# Patient Record
Sex: Female | Born: 1995 | Race: Black or African American | Hispanic: No | Marital: Single | Smoking: Current every day smoker
Health system: Southern US, Community
[De-identification: ages and names within clinical notes are randomized; demographics above are authoritative.]

## PROBLEM LIST (undated history)

## (undated) DIAGNOSIS — F329 Major depressive disorder, single episode, unspecified: Secondary | ICD-10-CM

## (undated) DIAGNOSIS — M779 Enthesopathy, unspecified: Secondary | ICD-10-CM

## (undated) DIAGNOSIS — F32A Depression, unspecified: Secondary | ICD-10-CM

---
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
## 2018-01-05 ENCOUNTER — Emergency Department (HOSPITAL_BASED_OUTPATIENT_CLINIC_OR_DEPARTMENT_OTHER)
Admission: EM | Admit: 2018-01-05 | Discharge: 2018-01-05 | Disposition: A | Discharge status: Home / Self Care | Payer: Managed Care, Other (non HMO) | Attending: Emergency Medicine | Admitting: Emergency Medicine

## 2018-01-05 ENCOUNTER — Other Ambulatory Visit: Payer: Self-pay

## 2018-01-05 ENCOUNTER — Emergency Department (HOSPITAL_BASED_OUTPATIENT_CLINIC_OR_DEPARTMENT_OTHER): Payer: Managed Care, Other (non HMO)

## 2018-01-05 ENCOUNTER — Encounter (HOSPITAL_BASED_OUTPATIENT_CLINIC_OR_DEPARTMENT_OTHER): Payer: Self-pay | Admitting: Emergency Medicine

## 2018-01-05 DIAGNOSIS — Z79899 Other long term (current) drug therapy: Secondary | ICD-10-CM | POA: Diagnosis not present

## 2018-01-05 DIAGNOSIS — R69 Illness, unspecified: Secondary | ICD-10-CM

## 2018-01-05 DIAGNOSIS — M7918 Myalgia, other site: Secondary | ICD-10-CM | POA: Diagnosis present

## 2018-01-05 DIAGNOSIS — J111 Influenza due to unidentified influenza virus with other respiratory manifestations: Secondary | ICD-10-CM

## 2018-01-05 DIAGNOSIS — F17210 Nicotine dependence, cigarettes, uncomplicated: Secondary | ICD-10-CM | POA: Insufficient documentation

## 2018-01-05 DIAGNOSIS — R112 Nausea with vomiting, unspecified: Secondary | ICD-10-CM | POA: Diagnosis not present

## 2018-01-05 HISTORY — DX: Depression, unspecified: F32.A

## 2018-01-05 HISTORY — DX: Major depressive disorder, single episode, unspecified: F32.9

## 2018-01-05 HISTORY — DX: Enthesopathy, unspecified: M77.9

## 2018-01-05 LAB — CBC WITH DIFFERENTIAL/PLATELET
Basophils Absolute: 0 10*3/uL (ref 0.0–0.1)
Basophils Relative: 0 %
Eosinophils Absolute: 0 10*3/uL (ref 0.0–0.7)
Eosinophils Relative: 0 %
HCT: 42.5 % (ref 36.0–46.0)
Hemoglobin: 14.8 g/dL (ref 12.0–15.0)
Lymphocytes Relative: 11 %
Lymphs Abs: 0.4 10*3/uL — ABNORMAL LOW (ref 0.7–4.0)
MCH: 27.7 pg (ref 26.0–34.0)
MCHC: 34.8 g/dL (ref 30.0–36.0)
MCV: 79.4 fL (ref 78.0–100.0)
Monocytes Absolute: 0.3 10*3/uL (ref 0.1–1.0)
Monocytes Relative: 9 %
Neutro Abs: 2.6 10*3/uL (ref 1.7–7.7)
Neutrophils Relative %: 80 %
Platelets: 143 10*3/uL — ABNORMAL LOW (ref 150–400)
RBC: 5.35 MIL/uL — ABNORMAL HIGH (ref 3.87–5.11)
RDW: 13.3 % (ref 11.5–15.5)
WBC: 3.3 10*3/uL — ABNORMAL LOW (ref 4.0–10.5)

## 2018-01-05 LAB — RAPID STREP SCREEN (MED CTR MEBANE ONLY): Streptococcus, Group A Screen (Direct): NEGATIVE

## 2018-01-05 LAB — COMPREHENSIVE METABOLIC PANEL
ALT: 18 U/L (ref 14–54)
AST: 34 U/L (ref 15–41)
Albumin: 3.6 g/dL (ref 3.5–5.0)
Alkaline Phosphatase: 50 U/L (ref 38–126)
Anion gap: 10 (ref 5–15)
BUN: 8 mg/dL (ref 6–20)
CO2: 21 mmol/L — ABNORMAL LOW (ref 22–32)
Calcium: 8.4 mg/dL — ABNORMAL LOW (ref 8.9–10.3)
Chloride: 102 mmol/L (ref 101–111)
Creatinine, Ser: 1.01 mg/dL — ABNORMAL HIGH (ref 0.44–1.00)
GFR calc Af Amer: >60 mL/min (ref >60)
GFR calc non Af Amer: >60 mL/min (ref >60)
Glucose, Bld: 123 mg/dL — ABNORMAL HIGH (ref 65–99)
Potassium: 4 mmol/L (ref 3.5–5.1)
Sodium: 133 mmol/L — ABNORMAL LOW (ref 135–145)
Total Bilirubin: 0.4 mg/dL (ref 0.3–1.2)
Total Protein: 7.3 g/dL (ref 6.5–8.1)

## 2018-01-05 LAB — LIPASE, BLOOD: Lipase: 25 U/L (ref 11–51)

## 2018-01-05 MED ORDER — IBUPROFEN 600 MG PO TABS: 600.0000 mg | ORAL_TABLET | Freq: Four times a day (QID) | ORAL | 0 refills | Status: DC | PRN

## 2018-01-05 MED ORDER — ONDANSETRON 4 MG PO TBDP
4.0000 mg | ORAL_TABLET | Freq: Once | ORAL | Status: AC | PRN
  Administered 2018-01-05: 4 mg via ORAL
  Filled 2018-01-05: qty 1

## 2018-01-05 MED ORDER — ACETAMINOPHEN 325 MG PO TABS
650.0000 mg | ORAL_TABLET | Freq: Once | ORAL | Status: AC | PRN
  Administered 2018-01-05: 650 mg via ORAL
  Filled 2018-01-05: qty 2

## 2018-01-05 MED ORDER — SODIUM CHLORIDE 0.9 % IV BOLUS
1000.0000 mL | Freq: Once | INTRAVENOUS | Status: AC
  Administered 2018-01-05: 1000 mL via INTRAVENOUS

## 2018-01-05 MED ORDER — KETOROLAC TROMETHAMINE 30 MG/ML IJ SOLN
30.0000 mg | Freq: Once | INTRAMUSCULAR | Status: AC
  Administered 2018-01-05: 30 mg via INTRAVENOUS
  Filled 2018-01-05: qty 1

## 2018-01-05 MED ORDER — ONDANSETRON HCL 4 MG PO TABS: 4.0000 mg | ORAL_TABLET | Freq: Four times a day (QID) | ORAL | 0 refills | Status: AC

## 2018-01-05 MED ORDER — ACETAMINOPHEN 500 MG PO TABS: 500.0000 mg | ORAL_TABLET | Freq: Four times a day (QID) | ORAL | 0 refills | Status: AC | PRN

## 2018-01-05 NOTE — ED Triage Notes (Signed)
Patient states that she started to have a "migraine" headache 2 days ago. She also has chronic pain to her right arm. So she reports that she took her arm medication instead of her headache medication and now have generalized aches all over and threw up one time at work.

## 2018-01-05 NOTE — ED Provider Notes (Signed)
[facility] EMERGENCY DEPARTMENT Provider Note   CSN: [id number] Arrival date & time: 01/05/18  1814     History   Chief Complaint Chief Complaint  Patient presents with  . Generalized Body Aches    HPI Shannon Shah is a 22 y.o. female with history of depression, tendinitis who presents with a 3-day history of fever, chills, headache, and 1 to 2-day history of nausea and emesis.  Patient has had intermittent abdominal cramping.  She denies diarrhea or bloody stools.  She has had generalized body aches.  She reports a tightness in her chest and has had intermittent cough.  She has had nasal congestion.  She denies any shortness of breath.  She denies any new leg pain or swelling, recent long trips, surgeries, known cancer.  She has not taken any medications at home for symptoms.  She denies any urinary symptoms.  HPI  Past Medical History:  Diagnosis Date  . Depression   . Tendonitis     There are no active problems to display for this patient.   History reviewed. No pertinent surgical history.   OB History   None      Home Medications    Prior to Admission medications   Medication Sig Start Date End Date Taking? Authorizing Provider  citalopram (CELEXA) 20 MG tablet Take 20 mg by mouth daily.   Yes [provider]  acetaminophen (TYLENOL) 500 MG tablet Take 1 tablet (500 mg total) by mouth every 6 (six) hours as needed. 01/05/18   Payam Gribble, Waylan Boga, PA-C  ibuprofen (ADVIL,MOTRIN) 600 MG tablet Take 1 tablet (600 mg total) by mouth every 6 (six) hours as needed. 01/05/18   Nakiya Rallis, Waylan Boga, PA-C  ondansetron (ZOFRAN) 4 MG tablet Take 1 tablet (4 mg total) by mouth every 6 (six) hours. 01/05/18   Emi Holes, PA-C    Family History History reviewed. No pertinent family history.  Social History Social History   Tobacco Use  . Smoking status: Current Every Day Smoker  . Smokeless tobacco: Never Used  Substance Use Topics  . Alcohol use: Never     Frequency: Never  . Drug use: Never     Allergies   Amoxicillin   Review of Systems Review of Systems  Constitutional: Positive for appetite change, chills and fever.  HENT: Positive for congestion. Negative for ear pain, facial swelling and sore throat.   Respiratory: Positive for cough. Negative for shortness of breath.   Cardiovascular: Negative for chest pain.  Gastrointestinal: Positive for abdominal pain (cramping), nausea and vomiting. Negative for diarrhea.  Genitourinary: Negative for dysuria and flank pain.  Musculoskeletal: Negative for back pain.  Skin: Negative for rash and wound.  Neurological: Positive for headaches.  Psychiatric/Behavioral: The patient is not nervous/anxious.      Physical Exam Updated Vital Signs BP (!) 111/58 (BP Location: Right Arm)   Pulse 74   Temp 98.2 F (36.8 C) (Oral)   Resp 16   Ht  (1.702 m)   Wt 117.9 kg (260 lb)   LMP 01/03/2018   SpO2 100%   BMI 40.72 kg/m   Physical Exam  Constitutional: She appears well-developed and well-nourished. No distress.  HENT:  Head: Normocephalic and atraumatic.  Right Ear: Tympanic membrane normal.  Left Ear: Tympanic membrane normal.  Mouth/Throat: Oropharynx is clear and moist. No oropharyngeal exudate.  Eyes: Pupils are equal, round, and reactive to light. Conjunctivae are normal. Right eye exhibits no discharge. Left eye exhibits no  discharge. No scleral icterus.  Neck: Normal range of motion. Neck supple. No thyromegaly present.  Cardiovascular: Normal rate, regular rhythm, normal heart sounds and intact distal pulses. Exam reveals no gallop and no friction rub.  No murmur heard. Pulmonary/Chest: Effort normal and breath sounds normal. No stridor. No respiratory distress. She has no wheezes. She has no rales.  Abdominal: Soft. Bowel sounds are normal. She exhibits no distension. There is no tenderness. There is no rebound and no guarding.  Musculoskeletal: She exhibits no  edema.  Lymphadenopathy:    She has no cervical adenopathy.  Neurological: She is alert. Coordination normal.  Skin: Skin is warm and dry. No rash noted. She is not diaphoretic. No pallor.  Psychiatric: She has a normal mood and affect.  Nursing note and vitals reviewed.    ED Treatments / Results  Labs (all labs ordered are listed, but only abnormal results are displayed) Labs Reviewed  COMPREHENSIVE METABOLIC PANEL - Abnormal; Notable for the following components:      Result Value   Sodium 133 (*)    CO2 21 (*)    Glucose, Bld 123 (*)    Creatinine, Ser 1.01 (*)    Calcium 8.4 (*)    All other components within normal limits  CBC WITH DIFFERENTIAL/PLATELET - Abnormal; Notable for the following components:   WBC 3.3 (*)    RBC 5.35 (*)    Platelets 143 (*)    Lymphs Abs 0.4 (*)    All other components within normal limits  RAPID STREP SCREEN ([facility] ONLY)  CULTURE, GROUP A STREP ([facility])  LIPASE, BLOOD    EKG None  Radiology Dg Chest 2 View  Result Date: 01/05/2018 CLINICAL DATA:  Shortness of breath and chest pain EXAM: CHEST - 2 VIEW COMPARISON:  None. FINDINGS: There is no edema or consolidation. Heart size and pulmonary vascularity are normal. No adenopathy. There is midthoracic dextroscoliosis. IMPRESSION: No edema or consolidation. Electronically Signed   By: Bretta Bang III M.D.   On: 01/05/2018 20:00    Procedures Procedures (including critical care time)  Medications Ordered in ED Medications  acetaminophen (TYLENOL) tablet 650 mg (650 mg Oral Given 01/05/18 1850)  ondansetron (ZOFRAN-ODT) disintegrating tablet 4 mg (4 mg Oral Given 01/05/18 1850)  ketorolac (TORADOL) 30 MG/ML injection 30 mg (30 mg Intravenous Given 01/05/18 1941)  sodium chloride 0.9 % bolus 1,000 mL (0 mLs Intravenous Stopped 01/05/18 2051)     Initial Impression / Assessment and Plan / ED Course  I have reviewed the triage vital signs and the nursing notes.  Pertinent labs &  imaging results that were available during my care of the patient were reviewed by me and considered in my medical decision making (see chart for details).     Patient with influenza-like symptoms versus other viral syndrome.  Patient's fever reduced in the ED with Tylenol and Toradol.  Headache improved after Toradol and fluids.  CBC shows WBC 3.3, platelets 143.  CMP shows sodium 133, creatinine 1.01. Lipase within normal limits.  Strep negative.  Chest x-ray negative.  Patient is tolerating oral fluids.  She is feeling much better including her chest tightness.  I feel her chest pain is related to her generalized body aches.  Will treat supportively with Zofran, ibuprofen, Tylenol.  Advised supportive treatment including rest and fluids.  Return precautions discussed.  Follow-up to PCP as needed.  Patient vitals stable and discharged in satisfactory condition.  Final Clinical Impressions(s) / ED Diagnoses  Final diagnoses:  Influenza-like illness    ED Discharge Orders        Ordered    ondansetron (ZOFRAN) 4 MG tablet  Every 6 hours     01/05/18 2048    ibuprofen (ADVIL,MOTRIN) 600 MG tablet  Every 6 hours PRN     01/05/18 2048    acetaminophen (TYLENOL) 500 MG tablet  Every 6 hours PRN     01/05/18 2048       Emi Holes, PA-C 01/06/18 9147    Arby Barrette, MD 01/14/18 [phone]

## 2018-01-05 NOTE — ED Notes (Signed)
Ok for patient to have water per Dr. Donnald Garre

## 2018-01-05 NOTE — Discharge Instructions (Signed)
Medications: Zofran, ibuprofen, Tylenol  Treatment: Take Zofran every 6 hours as needed for nausea or vomiting.  Take ibuprofen and Tylenol alternating as prescribed.  Drink plenty of fluids.  Get plenty of rest.  Follow-up: Please follow-up with your doctor in 3 to 4 days for recheck.  Please return to the emergency department if you develop any new or worsening symptoms.  I recommend repeat lab work with your PCP in 2 to 3 weeks when you are feeling better.

## 2018-01-05 NOTE — ED Notes (Signed)
ED Provider at bedside. 

## 2018-01-08 LAB — CULTURE, GROUP A STREP (THRC)

## 2019-05-31 IMAGING — DX DG CHEST 2V
3 series · 3 of 3 positions shown · non-contrast
Comparison: None.

CLINICAL DATA: Shortness of breath and chest pain

EXAM:
CHEST - 2 VIEW

[chest pa]
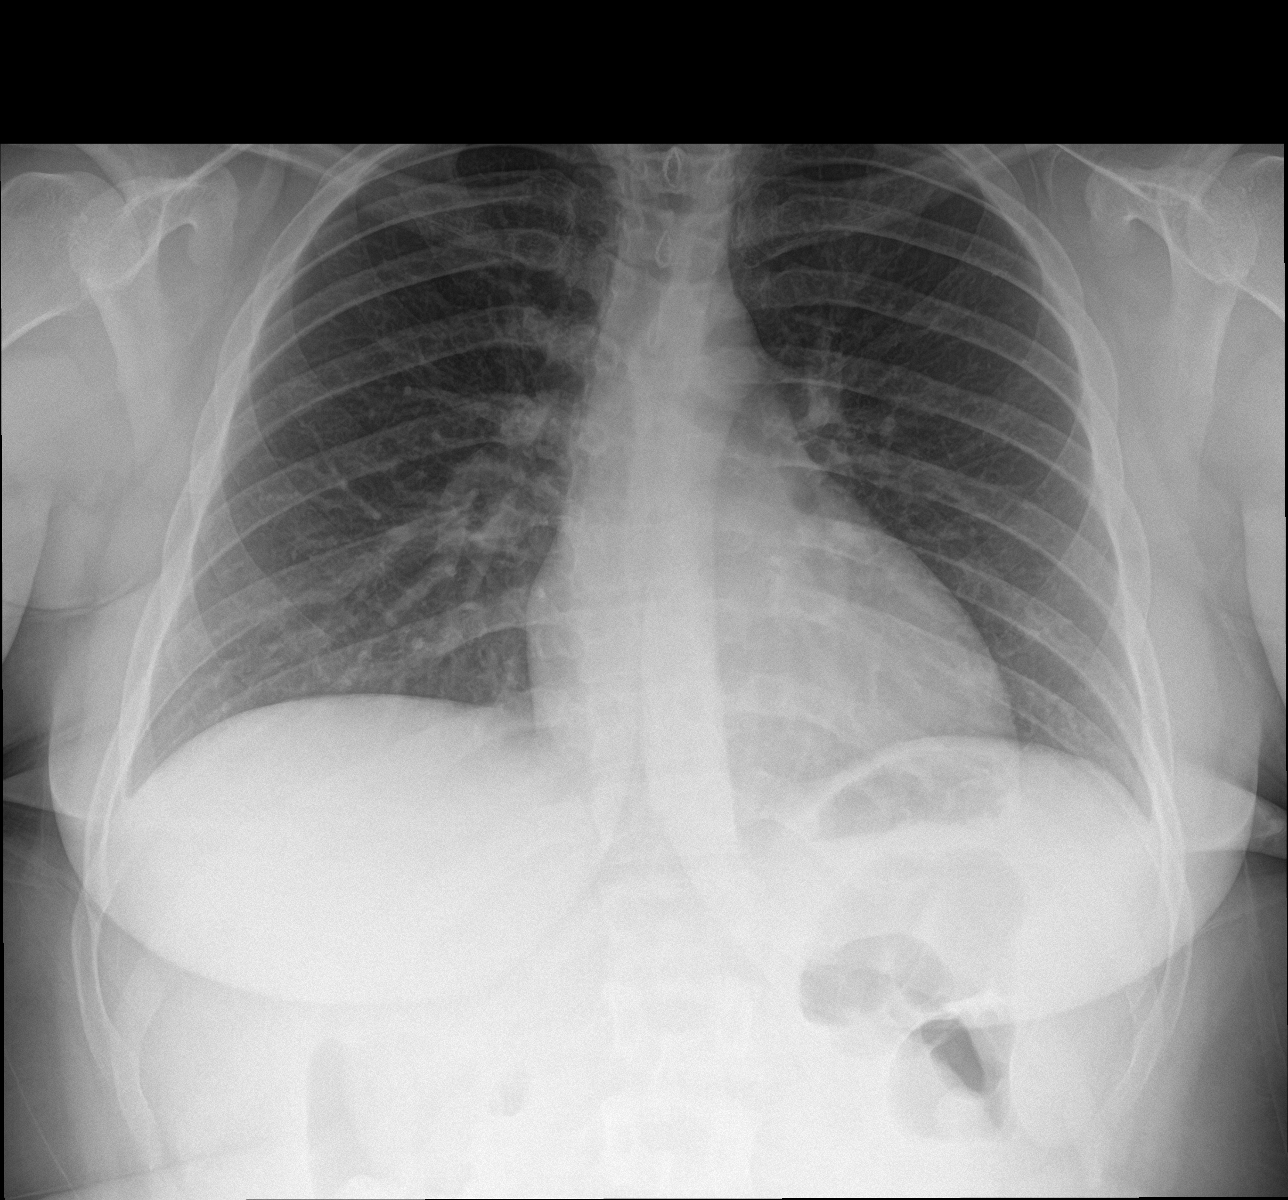

[chest lat (1 of 2)]
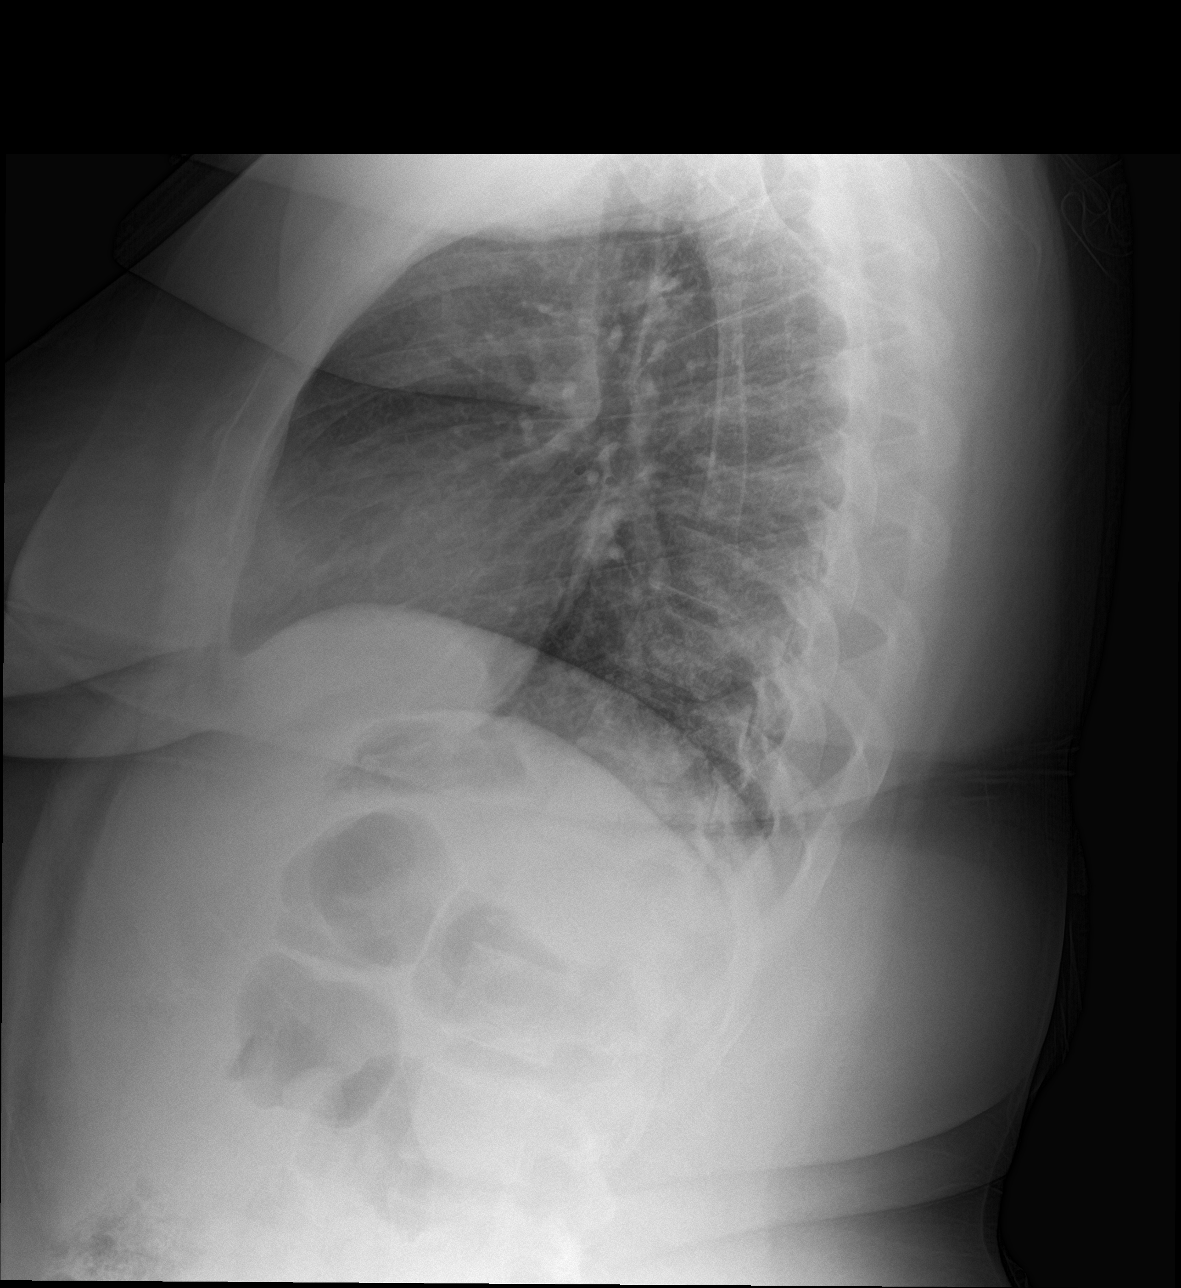

[chest lat (2 of 2)]
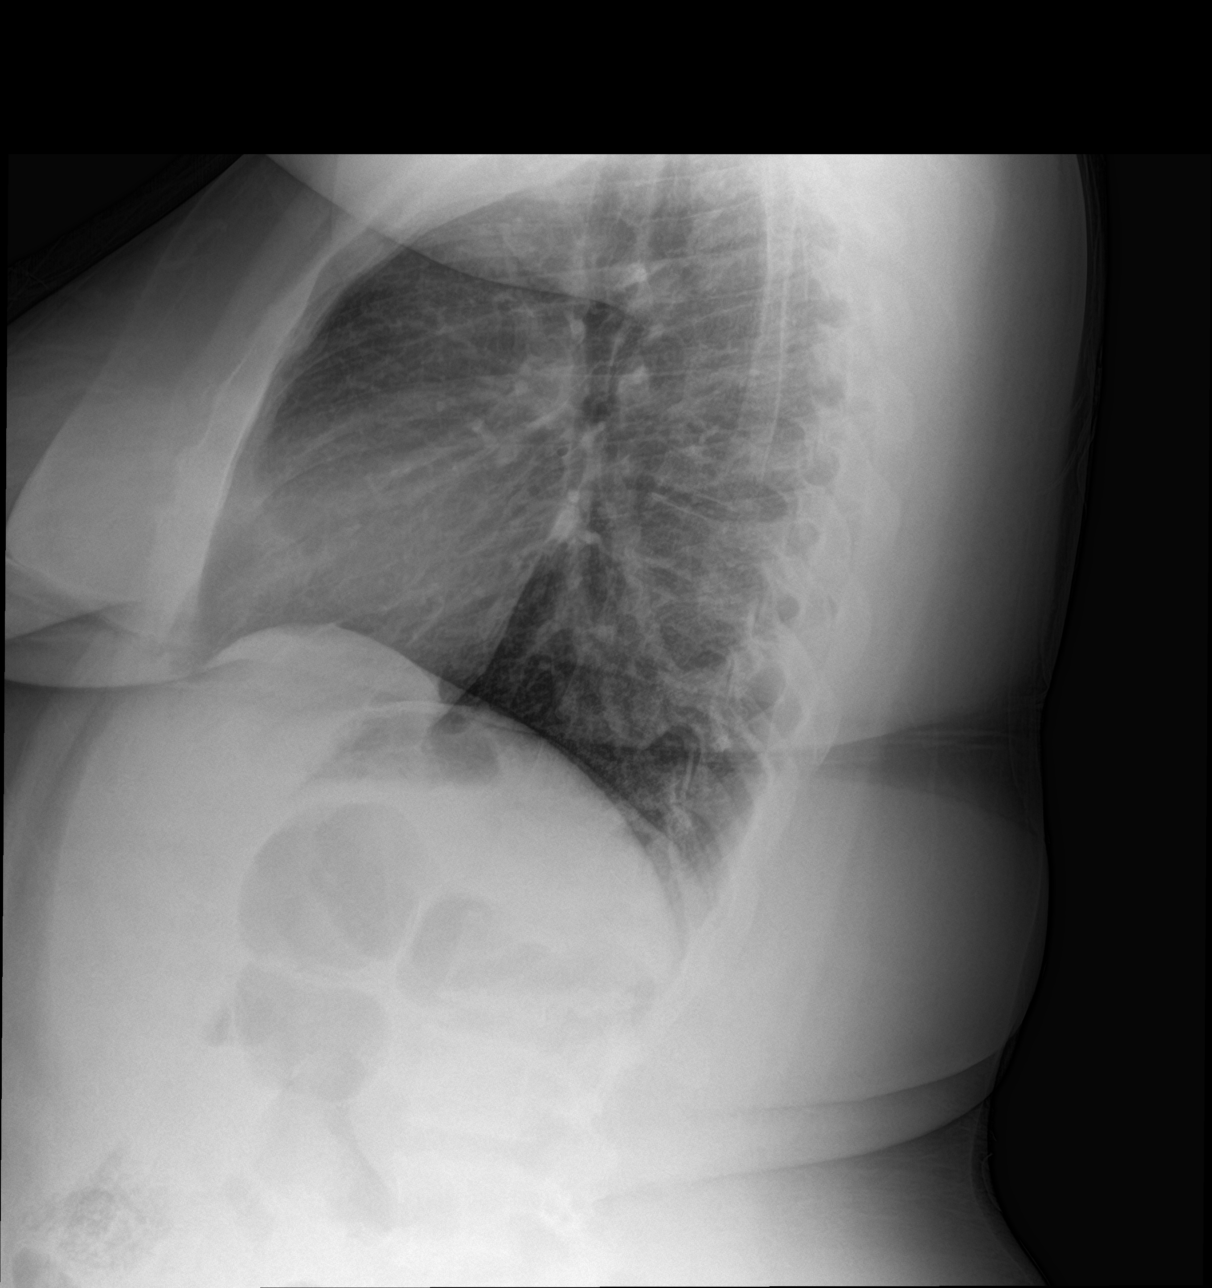

[3 of 3 positions shown; findings below may reference images not displayed]

FINDINGS: There is no edema or consolidation. Heart size and pulmonary
vascularity are normal. No adenopathy. There is midthoracic
dextroscoliosis.
IMPRESSION: No edema or consolidation.

## 2020-08-22 ENCOUNTER — Ambulatory Visit
Admission: EM | Admit: 2020-08-22 | Discharge: 2020-08-22 | Disposition: A | Discharge status: Home / Self Care | Payer: BC Managed Care – PPO | Attending: Internal Medicine | Admitting: Internal Medicine

## 2020-08-22 ENCOUNTER — Other Ambulatory Visit: Payer: Self-pay

## 2020-08-22 DIAGNOSIS — M549 Dorsalgia, unspecified: Secondary | ICD-10-CM

## 2020-08-22 MED ORDER — IBUPROFEN 600 MG PO TABS: 600.0000 mg | ORAL_TABLET | Freq: Four times a day (QID) | ORAL | 0 refills | Status: DC | PRN

## 2020-08-22 MED ORDER — IBUPROFEN 600 MG PO TABS: 600.0000 mg | ORAL_TABLET | Freq: Four times a day (QID) | ORAL | 0 refills | Status: AC | PRN

## 2020-08-22 MED ORDER — CYCLOBENZAPRINE HCL 5 MG PO TABS: 5.0000 mg | ORAL_TABLET | Freq: Two times a day (BID) | ORAL | 0 refills | Status: AC | PRN

## 2020-08-22 NOTE — Discharge Instructions (Signed)
Medications as prescribed Gentle range of motion exercises No indication for x-rays at this time. If symptoms worsen please return to urgent care to be reevaluated.
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
## 2024-02-09 ENCOUNTER — Emergency Department (HOSPITAL_BASED_OUTPATIENT_CLINIC_OR_DEPARTMENT_OTHER)
Admission: EM | Admit: 2024-02-09 | Discharge: 2024-02-09 | Disposition: A | Discharge status: Home / Self Care | Attending: Emergency Medicine | Admitting: Emergency Medicine

## 2024-02-09 ENCOUNTER — Encounter (HOSPITAL_BASED_OUTPATIENT_CLINIC_OR_DEPARTMENT_OTHER): Payer: Self-pay

## 2024-02-09 ENCOUNTER — Emergency Department (HOSPITAL_BASED_OUTPATIENT_CLINIC_OR_DEPARTMENT_OTHER)

## 2024-02-09 ENCOUNTER — Other Ambulatory Visit: Payer: Self-pay

## 2024-02-09 DIAGNOSIS — J36 Peritonsillar abscess: Secondary | ICD-10-CM | POA: Insufficient documentation

## 2024-02-09 DIAGNOSIS — R059 Cough, unspecified: Secondary | ICD-10-CM | POA: Diagnosis present

## 2024-02-09 LAB — CBC WITH DIFFERENTIAL/PLATELET
Abs Immature Granulocytes: 0.04 10*3/uL (ref 0.00–0.07)
Basophils Absolute: 0 10*3/uL (ref 0.0–0.1)
Basophils Relative: 0 %
Eosinophils Absolute: 0.1 10*3/uL (ref 0.0–0.5)
Eosinophils Relative: 1 %
HCT: 40.1 % (ref 36.0–46.0)
Hemoglobin: 13.2 g/dL (ref 12.0–15.0)
Immature Granulocytes: 0 %
Lymphocytes Relative: 24 %
Lymphs Abs: 2.4 10*3/uL (ref 0.7–4.0)
MCH: 27.1 pg (ref 26.0–34.0)
MCHC: 32.9 g/dL (ref 30.0–36.0)
MCV: 82.3 fL (ref 80.0–100.0)
Monocytes Absolute: 0.7 10*3/uL (ref 0.1–1.0)
Monocytes Relative: 7 %
Neutro Abs: 6.9 10*3/uL (ref 1.7–7.7)
Neutrophils Relative %: 68 %
Platelets: 303 10*3/uL (ref 150–400)
RBC: 4.87 MIL/uL (ref 3.87–5.11)
RDW: 13 % (ref 11.5–15.5)
WBC: 10.2 10*3/uL (ref 4.0–10.5)
nRBC: 0 % (ref 0.0–0.2)

## 2024-02-09 LAB — RESP PANEL BY RT-PCR (RSV, FLU A&B, COVID)  RVPGX2
Influenza A by PCR: NEGATIVE
Influenza B by PCR: NEGATIVE
Resp Syncytial Virus by PCR: NEGATIVE
SARS Coronavirus 2 by RT PCR: NEGATIVE

## 2024-02-09 LAB — BASIC METABOLIC PANEL WITH GFR
Anion gap: 12 (ref 5–15)
BUN: 6 mg/dL (ref 6–20)
CO2: 25 mmol/L (ref 22–32)
Calcium: 9.1 mg/dL (ref 8.9–10.3)
Chloride: 103 mmol/L (ref 98–111)
Creatinine, Ser: 0.85 mg/dL (ref 0.44–1.00)
GFR, Estimated: >60 mL/min (ref >60)
Glucose, Bld: 85 mg/dL (ref 70–99)
Potassium: 4.1 mmol/L (ref 3.5–5.1)
Sodium: 140 mmol/L (ref 135–145)

## 2024-02-09 LAB — HCG, SERUM, QUALITATIVE: Preg, Serum: NEGATIVE

## 2024-02-09 LAB — GROUP A STREP BY PCR: Group A Strep by PCR: NOT DETECTED

## 2024-02-09 LAB — MONONUCLEOSIS SCREEN: Mono Screen: NEGATIVE

## 2024-02-09 MED ORDER — KETOROLAC TROMETHAMINE 15 MG/ML IJ SOLN
15.0000 mg | Freq: Once | INTRAMUSCULAR | Status: AC
  Administered 2024-02-09: 15 mg via INTRAVENOUS
  Filled 2024-02-09: qty 1

## 2024-02-09 MED ORDER — LACTATED RINGERS IV BOLUS
1000.0000 mL | Freq: Once | INTRAVENOUS | Status: AC
  Administered 2024-02-09: 1000 mL via INTRAVENOUS

## 2024-02-09 MED ORDER — CLINDAMYCIN PHOSPHATE 600 MG/50ML IV SOLN
600.0000 mg | Freq: Once | INTRAVENOUS | Status: AC
  Administered 2024-02-09: 600 mg via INTRAVENOUS
  Filled 2024-02-09: qty 50

## 2024-02-09 MED ORDER — FLUCONAZOLE 200 MG PO TABS: 200.0000 mg | ORAL_TABLET | Freq: Once | ORAL | 0 refills | Status: AC

## 2024-02-09 MED ORDER — DEXAMETHASONE SODIUM PHOSPHATE 10 MG/ML IJ SOLN
10.0000 mg | Freq: Once | INTRAMUSCULAR | Status: AC
  Administered 2024-02-09: 10 mg via INTRAVENOUS
  Filled 2024-02-09: qty 1

## 2024-02-09 MED ORDER — IOHEXOL 300 MG/ML  SOLN
75.0000 mL | Freq: Once | INTRAMUSCULAR | Status: AC | PRN
  Administered 2024-02-09: 75 mL via INTRAVENOUS

## 2024-02-09 MED ORDER — SODIUM CHLORIDE 0.9 % IV SOLN: INTRAVENOUS | Status: DC | PRN

## 2024-02-09 MED ORDER — CLINDAMYCIN HCL 150 MG PO CAPS: 450.0000 mg | ORAL_CAPSULE | Freq: Three times a day (TID) | ORAL | 0 refills | Status: AC

## 2024-02-09 NOTE — ED Notes (Signed)
 Patient transported to CT via stretcher.
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
## 2024-02-09 NOTE — ED Provider Notes (Signed)
 [place] [facility] Provider Note   CSN: [id number] Arrival date & time: 02/09/24  9176     Patient presents with: Sore Throat   Shannon Shah is a 28 y.o. female with no significant past medical history presents with concern for sore throat ongoing for the past 2 to 3 weeks.  States her right tonsil was initially the tonsil that was painful, and was treated with a course of azithromycin which did resolve her symptoms.  Shortly after completing this course of azithromycin, her left tonsil seemed to become swollen and painful.  She is still able to swallow, but reports pain with swallowing.  Also reports some congestion, ear pain, and mild dry cough.  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    Sore Throat       Prior to Admission medications   Medication Sig Start Date End Date Taking? Authorizing Provider  clindamycin  (CLEOCIN ) 150 MG capsule Take 3 capsules (450 mg total) by mouth 3 (three) times daily for 10 days. 02/09/24 02/19/24 Yes Veta Palma, PA-C  acetaminophen  (TYLENOL ) 500 MG tablet Take 1 tablet (500 mg total) by mouth every 6 (six) hours as needed. 01/05/18   Law, Alexandra M, PA-C  citalopram (CELEXA) 20 MG tablet Take 20 mg by mouth daily.    [provider]  cyclobenzaprine  (FLEXERIL ) 5 MG tablet Take 1 tablet (5 mg total) by mouth 2 (two) times daily as needed for muscle spasms. 08/22/20   LampteyAleene KIDD, MD  ibuprofen  (ADVIL ) 600 MG tablet Take 1 tablet (600 mg total) by mouth every 6 (six) hours as needed. 08/22/20   Lamptey, Aleene KIDD, MD  ondansetron  (ZOFRAN ) 4 MG tablet Take 1 tablet (4 mg total) by mouth every 6 (six) hours. 01/05/18   Rendell Lorane HERO, PA-C    Allergies: Amoxicillin    Review of Systems  HENT:  Positive for sore throat.     Updated Vital Signs BP 109/71 (BP Location: Left Arm)   Pulse 62   Temp 97.9 F (36.6 C) (Oral)   Resp 18   Ht 5' 7 (1.702 m)   Wt 122.5 kg   LMP 01/14/2024    SpO2 100%   BMI 42.29 kg/m   Physical Exam Vitals and nursing note reviewed.  Constitutional:      General: She is not in acute distress.    Appearance: She is well-developed.  HENT:     Head: Normocephalic and atraumatic.     Right Ear: Tympanic membrane normal.     Left Ear: Tympanic membrane normal.     Nose: Congestion present.     Mouth/Throat:     Comments: Able to open mouth fully, but with some discomfort  Left-sided peritonsillar abscess noted.  Uvula midline.  Able to visualize posterior oropharynx on the right side Patient swallowing secretions without difficulty.  Talking without muffled voice  Eyes:     Conjunctiva/sclera: Conjunctivae normal.    Cardiovascular:     Rate and Rhythm: Normal rate and regular rhythm.     Heart sounds: No murmur heard. Pulmonary:     Effort: Pulmonary effort is normal. No respiratory distress.     Breath sounds: Normal breath sounds.  Abdominal:     Palpations: Abdomen is soft.     Tenderness: There is no abdominal tenderness.   Musculoskeletal:        General: No swelling.     Cervical back: Neck supple.   Skin:    General:  Skin is warm and dry.     Capillary Refill: Capillary refill takes less than 2 seconds.   Neurological:     Mental Status: She is alert.   Psychiatric:        Mood and Affect: Mood normal.     (all labs ordered are listed, but only abnormal results are displayed) Labs Reviewed  RESP PANEL BY RT-PCR (RSV, FLU A&B, COVID)  [id number]  GROUP A STREP BY PCR  CBC WITH DIFFERENTIAL/PLATELET  BASIC METABOLIC PANEL WITH GFR  MONONUCLEOSIS SCREEN  HCG, SERUM, QUALITATIVE    EKG: None  Radiology: CT Soft Tissue Neck W Contrast Result Date: 02/09/2024 CLINICAL DATA:  Provided history: Left peritonsillar abscess. Sore throat, swollen left tonsil, jaw pain and swelling. EXAM: CT NECK WITH CONTRAST TECHNIQUE: Multidetector CT imaging of the neck was performed using the standard protocol following the bolus  administration of intravenous contrast. RADIATION DOSE REDUCTION: This exam was performed according to the departmental dose-optimization program which includes automated exposure control, adjustment of the mA and/or kV according to patient size and/or use of iterative reconstruction technique. CONTRAST:  75mL OMNIPAQUE  IOHEXOL  300 MG/ML  SOLN COMPARISON:  None. FINDINGS: Pharynx and larynx: Prominence of the palatine tonsils (left greater than right). Superimposed 1.8 x 2.0 circumscribed hypodense focus in the region of the left palatine tonsil compatible with a tonsillar/peritonsillar abscess (series 3, image 36). No more than mild effacement of the oropharyngeal airway. Unremarkable appearance of the epiglottis/larynx. Salivary glands: No inflammation, mass, or stone. Thyroid: Unremarkable. Lymph nodes: Mildly enlarged level 2 lymph nodes bilaterally, likely reactive. Vascular: The major vascular structures of the neck are patent. Limited intracranial: No evidence of an acute intracranial abnormality within the field of view. Visualized orbits: Incompletely imaged. No orbital mass or acute orbital finding at the imaged levels. Mastoids and visualized paranasal sinuses: Portions of the frontal sinuses are excluded from the field of view superiorly. No significant paranasal sinus disease or mastoid effusion at the imaged levels. Skeleton: Nonspecific reversal of the expected cervical lordosis. Slight grade 1 anterolisthesis at C2-C3, C3-C4 and C4-C5. Upper chest: No consolidation within the imaged lung apices. IMPRESSION: 1. Findings compatible with acute tonsillitis/pharyngitis. Superimposed 1.8 x 2.0 cm left tonsillar/peritonsillar abscess. No more than mild effacement of the oropharyngeal airway. 2. Mildly prominent bilateral level 2 lymph nodes, likely reactive. Electronically Signed   By: Rockey Childs D.O.   On: 02/09/2024 10:49     Procedures   Medications Ordered in the ED  0.9 %  sodium chloride   infusion ( Intravenous New Bag/Given 02/09/24 0938)  lactated ringers  bolus 1,000 mL (0 mLs Intravenous Stopped 02/09/24 1058)  ketorolac  (TORADOL ) 15 MG/ML injection 15 mg (15 mg Intravenous Given 02/09/24 0932)  dexamethasone  (DECADRON ) injection 10 mg (10 mg Intravenous Given 02/09/24 0932)  clindamycin  (CLEOCIN ) IVPB 600 mg (0 mg Intravenous Stopped 02/09/24 1057)  iohexol  (OMNIPAQUE ) 300 MG/ML solution 75 mL (75 mLs Intravenous Contrast Given 02/09/24 1029)                                    Medical Decision Making Amount and/or Complexity of Data Reviewed Labs: ordered. Radiology: ordered.  Risk Prescription drug management.     Differential diagnosis includes but is not limited to strep pharyngitis, viral pharyngitis, mononucleosis, peritonsillar abscess, postnasal drip, deep space infection, Ludwig angina, salivary stones  ED Course:  Upon initial evaluation, patient is well-appearing, no acute distress.  Stable vitals.  On exam, she has a left-sided peritonsillar abscess.  Uvula still midline and able to visualize posterior oropharynx on the right side.  She is swallowing secretions without difficulty and talking without difficulty.  Labs Ordered: I Ordered, and personally interpreted labs.  The pertinent results include:   CBC and BMP within normal limits Strep negative Mono negative Pregnancy negative COVID, flu, RSV negative  Imaging Studies ordered: I ordered imaging studies including CT soft tissue neck with contrast I independently visualized the imaging with scope of interpretation limited to determining acute life threatening conditions related to emergency care. Imaging showed findings compatible with acute tonsillitis/pharyngitis.  Superimposed 1.8 x 2 cm left tonsillar/peritonsillar abscess.  No more than mild effacement of the oropharyngeal airway.  Mildly prominent bilateral level 2 lymph nodes, likely reactive I agree with the radiologist  interpretation    Consultations Obtained: I requested consultation with the ENT Dr. Maggie,  and discussed lab and imaging findings as well as pertinent plan - they recommend: Follow-up with them in office tomorrow, 02/10/2024.  He will have office call patient tomorrow morning around 8 AM.  If patient has not heard from my office by 10 AM, he requests patient called his office.  Medications Given: LR bolus Toradol  Decadron  Clindamycin   Upon re-evaluation, patient reports she is feeling much better with the medications given.  Discussed that ENT recommends follow-up in office tomorrow.  She states she will be able to attend an appointment at any time tomorrow.  I let ENT Dr. Maggie know that patient will be able to follow-up.  He will have his office call patient to schedule an appointment tomorrow.  Tolerating p.o. intake, stable vitals, she is stable and appropriate discharge home  Impression: Left peritonsillar abscess  Disposition:  The patient was discharged home with instructions to take course of clindamycin  as prescribed.  She understands that if she develops a persistent diarrhea or green foul-smelling diarrhea that she needs to follow-up with her PCP as this could be a C. difficile infection.  Follow up with ENT Dr. Maggie in office tomorrow.  Patient was instructed to call his office if she has not heard from them by 10 AM. His office contact information was provided. Return precautions given.    Record Review: External records from outside source obtained and reviewed including urgent care note where she was prescribed course of amoxicillin     This chart was dictated using voice recognition software, Dragon. Despite the best efforts of this provider to proofread and correct errors, errors may still occur which can change documentation meaning.       Final diagnoses:  Peritonsillar abscess    ED Discharge Orders          Ordered    clindamycin   (CLEOCIN ) 150 MG capsule  3 times daily        02/09/24 1132               Veta Palma, PA-C 02/09/24 1139    Elnor Savant A, DO 02/17/24 1516

## 2024-02-09 NOTE — Discharge Instructions (Addendum)
 Your CT scan showed that you have a left-sided peritonsillar abscess.  This is a collection of pus (an infection) in the left tonsil.  You have been prescribed an antibiotic called clindamycin. Take this antibiotic 3 times a day for the next 10 days.  You were given your first dose here today.  You may take your next dose later this evening.  Take the full course of your antibiotic even if you start feeling better. Antibiotics may cause you to have diarrhea.  If you have persistent diarrhea even after stopping this antibiotic, or very green foul-smelling diarrhea, please follow-up with your PCP for evaluation as this antibiotic increases your risk for a C. difficile infection which can cause bad diarrhea and requires treatment.  You may take up to 1000mg  of tylenol  every 6 hours as needed for pain.  Do not take more then 4g per day.  You may use up to 600mg  ibuprofen  every 6 hours as needed for pain.  Do not exceed 2.4g of ibuprofen  per day.  Please follow-up with ENT Dr. Maggie tomorrow.  He will have his office contact you around 8 AM tomorrow morning.  If you have not heard from them by 10 AM, please call their office directly to schedule an appointment. If you call, request to speak to his medical assistant.   Return to the ER if you have any difficulty swallowing or breathing, any other new or concerning symptoms
